# Patient Record
Sex: Male | Born: 1995 | Race: Black or African American | Hispanic: No | Smoking: Never smoker
Health system: Southern US, Community
[De-identification: ages and names within clinical notes are randomized; demographics above are authoritative.]

---

## 2015-08-28 ENCOUNTER — Encounter (HOSPITAL_COMMUNITY): Payer: Self-pay | Admitting: Emergency Medicine

## 2015-08-28 ENCOUNTER — Emergency Department (HOSPITAL_COMMUNITY)
Admission: EM | Admit: 2015-08-28 | Discharge: 2015-08-28 | Disposition: A | Discharge status: Home / Self Care | Payer: BC Managed Care – PPO | Source: Home / Self Care | Attending: Family Medicine | Admitting: Family Medicine

## 2015-08-28 ENCOUNTER — Emergency Department (INDEPENDENT_AMBULATORY_CARE_PROVIDER_SITE_OTHER): Payer: BC Managed Care – PPO

## 2015-08-28 DIAGNOSIS — M129 Arthropathy, unspecified: Secondary | ICD-10-CM

## 2015-08-28 DIAGNOSIS — M19079 Primary osteoarthritis, unspecified ankle and foot: Secondary | ICD-10-CM

## 2015-08-28 MED ORDER — NAPROXEN 500 MG PO TABS: 500.0000 mg | ORAL_TABLET | Freq: Two times a day (BID) | ORAL | Status: AC

## 2015-08-28 MED ORDER — PREDNISONE 50 MG PO TABS: ORAL_TABLET | ORAL | Status: AC

## 2015-08-28 NOTE — ED Notes (Signed)
C/o foot pain onset Thursday am when he woke up.... Denies inj/trauma Sx today include: localized fever, redness and pain when bearing wt A&O x4... Brought back in wheelchair... No acute distress.

## 2015-08-28 NOTE — ED Provider Notes (Signed)
CSN: [id number]     Arrival date & time 08/28/15  1554 History   First MD Initiated Contact with Patient 08/28/15 1741     No chief complaint on file.  (Consider location/radiation/quality/duration/timing/severity/associated sxs/prior Treatment) HPI History obtained from patient:   LOCATION: right foot SEVERITY: DURATION: CONTEXT: QUALITY: MODIFYING FACTORS: ASSOCIATED SYMPTOMS: TIMING: OCCUPATION:  No past medical history on file. No past surgical history on file. No family history on file. Social History  Substance Use Topics  . Smoking status: Not on file  . Smokeless tobacco: Not on file  . Alcohol Use: Not on file    Review of Systems ROS +'ve right foot pain  Denies: HEADACHE, NAUSEA, ABDOMINAL PAIN, CHEST PAIN, CONGESTION, DYSURIA, SHORTNESS OF BREATH  Allergies  Review of patient's allergies indicates not on file.  Home Medications   Prior to Admission medications   Not on File   Meds Ordered and Administered this Visit  Medications - No data to display  There were no vitals taken for this visit. No data found.   Physical Exam  Constitutional: He appears well-developed and well-nourished.  HENT:  Head: Normocephalic and atraumatic.  Pulmonary/Chest: Effort normal.  Musculoskeletal: He exhibits tenderness.       Right foot: There is tenderness.       Feet:  Nursing note and vitals reviewed.   ED Course  Procedures (including critical care time)  Labs Review Labs Reviewed - No data to display  Imaging Review No results found.   Visual Acuity Review  Right Eye Distance:   Left Eye Distance:   Bilateral Distance:    Right Eye Near:   Left Eye Near:    Bilateral Near:        Review of foot xray no bony injury  MDM   1. Inflammation of foot joint      Patient is advised to continue home symptomatic treatment. Prescription for  Prednisone and naproxen sent pharmacy patient has indicated. Patient is advised that if there are  new or worsening symptoms or attend the emergency department, or contact primary care provider. Instructions of care provided discharged home in stable condition.  THIS NOTE WAS GENERATED USING A VOICE RECOGNITION SOFTWARE PROGRAM. ALL REASONABLE EFFORTS  WERE MADE TO PROOFREAD THIS DOCUMENT FOR ACCURACY.    Tharon AquasFrank C Patrick, PA 08/28/15 [phone]

## 2015-08-28 NOTE — Discharge Instructions (Signed)
Gout °Gout is when your joints become red, sore, and swell (inflamed). This is caused by the buildup of uric acid crystals in the joints. Uric acid is a chemical that is normally in the blood. If the level of uric acid gets too high in the blood, these crystals form in your joints and tissues. Over time, these crystals can form into masses near the joints and tissues. These masses can destroy bone and cause the bone to look misshapen (deformed). °HOME CARE  °· Do not take aspirin for pain. °· Only take medicine as told by your doctor. °· Rest the joint as much as you can. When in bed, keep sheets and blankets off painful areas. °· Keep the sore joints raised (elevated). °· Put warm or cold packs on painful joints. Use of warm or cold packs depends on which works best for you. °· Use crutches if the painful joint is in your leg. °· Drink enough fluids to keep your pee (urine) clear or pale yellow. Limit alcohol, sugary drinks, and drinks with fructose in them. °· Follow your diet instructions. Pay careful attention to how much protein you eat. Include fruits, vegetables, whole grains, and fat-free or low-fat milk products in your daily diet. Talk to your doctor or dietitian about the use of coffee, vitamin C, and cherries. These may help lower uric acid levels. °· Keep a healthy body weight. °GET HELP RIGHT AWAY IF:  °· You have watery poop (diarrhea), throw up (vomit), or have any side effects from medicines. °· You do not feel better in 24 hours, or you are getting worse. °· Your joint becomes suddenly more tender, and you have chills or a fever. °MAKE SURE YOU:  °· Understand these instructions. °· Will watch your condition. °· Will get help right away if you are not doing well or get worse. °  °This information is not intended to replace advice given to you by your health care provider. Make sure you discuss any questions you have with your health care provider. °  °Document Released: 05/17/2008 Document Revised:  08/29/2014 Document Reviewed: 03/21/2012 °Elsevier Interactive Patient Education ©2016 [employer] ° °

## 2015-11-09 ENCOUNTER — Encounter (HOSPITAL_COMMUNITY): Payer: Self-pay | Admitting: Emergency Medicine

## 2015-11-09 ENCOUNTER — Emergency Department (HOSPITAL_COMMUNITY)
Admission: EM | Admit: 2015-11-09 | Discharge: 2015-11-09 | Disposition: A | Discharge status: Home / Self Care | Payer: BC Managed Care – PPO | Attending: Emergency Medicine | Admitting: Emergency Medicine

## 2015-11-09 DIAGNOSIS — Z791 Long term (current) use of non-steroidal anti-inflammatories (NSAID): Secondary | ICD-10-CM | POA: Insufficient documentation

## 2015-11-09 DIAGNOSIS — M25571 Pain in right ankle and joints of right foot: Secondary | ICD-10-CM | POA: Insufficient documentation

## 2015-11-09 NOTE — Discharge Instructions (Signed)
Read the information below.  You may return to the Emergency Department at any time for worsening condition or any new symptoms that concern you.  If you develop uncontrolled pain, weakness or numbness of the extremity, severe discoloration of the skin, or you are unable to walk or move your ankle, return to the ER for a recheck.      Ankle Pain Ankle pain is a common symptom. The bones, cartilage, tendons, and muscles of the ankle joint perform a lot of work each day. The ankle joint holds your body weight and allows you to move around. Ankle pain can occur on either side or back of 1 or both ankles. Ankle pain may be sharp and burning or dull and aching. There may be tenderness, stiffness, redness, or warmth around the ankle. The pain occurs more often when a person walks or puts pressure on the ankle. CAUSES  There are many reasons ankle pain can develop. It is important to work with your caregiver to identify the cause since many conditions can impact the bones, cartilage, muscles, and tendons. Causes for ankle pain include:  Injury, including a break (fracture), sprain, or strain often due to a fall, sports, or a high-impact activity.  Swelling (inflammation) of a tendon (tendonitis).  Achilles tendon rupture.  Ankle instability after repeated sprains and strains.  Poor foot alignment.  Pressure on a nerve (tarsal tunnel syndrome).  Arthritis in the ankle or the lining of the ankle.  Crystal formation in the ankle (gout or pseudogout). DIAGNOSIS  A diagnosis is based on your medical history, your symptoms, results of your physical exam, and results of diagnostic tests. Diagnostic tests may include X-ray exams or a computerized magnetic scan (magnetic resonance imaging, MRI). TREATMENT  Treatment will depend on the cause of your ankle pain and may include:  Keeping pressure off the ankle and limiting activities.  Using crutches or other walking support (a cane or brace).  Using  rest, ice, compression, and elevation.  Participating in physical therapy or home exercises.  Wearing shoe inserts or special shoes.  Losing weight.  Taking medications to reduce pain or swelling or receiving an injection.  Undergoing surgery. HOME CARE INSTRUCTIONS   Only take over-the-counter or prescription medicines for pain, discomfort, or fever as directed by your caregiver.  Put ice on the injured area.  Put ice in a plastic bag.  Place a towel between your skin and the bag.  Leave the ice on for 15-20 minutes at a time, 03-04 times a day.  Keep your leg raised (elevated) when possible to lessen swelling.  Avoid activities that cause ankle pain.  Follow specific exercises as directed by your caregiver.  Record how often you have ankle pain, the location of the pain, and what it feels like. This information may be helpful to you and your caregiver.  Ask your caregiver about returning to work or sports and whether you should drive.  Follow up with your caregiver for further examination, therapy, or testing as directed. SEEK MEDICAL CARE IF:   Pain or swelling continues or worsens beyond 1 week.  You have an oral temperature above 102 F (38.9 C).  You are feeling unwell or have chills.  You are having an increasingly difficult time with walking.  You have loss of sensation or other new symptoms.  You have questions or concerns. MAKE SURE YOU:   Understand these instructions.  Will watch your condition.  Will get help right away if you are  not doing well or get worse.   This information is not intended to replace advice given to you by your health care provider. Make sure you discuss any questions you have with your health care provider.   Document Released: 01/26/2010 Document Revised: 10/31/2011 Document Reviewed: 03/10/2015 Elsevier Interactive Patient Education [employer]

## 2015-11-09 NOTE — ED Notes (Signed)
Pt c/o right ankle pain onset Friday night while at work. Few months ago pt went to an urgent care for pain in right foot and was given naproxen. Pt has crutches. Pt wants to be tested for gout.

## 2015-11-09 NOTE — ED Provider Notes (Signed)
CSN: [id number]     Arrival date & time 11/09/15  4098 History   First MD Initiated Contact with Patient 11/09/15 [phone]     Chief Complaint  Patient presents with  . Ankle Pain     (Consider location/radiation/quality/duration/timing/severity/associated sxs/prior Treatment) The history is provided by the patient.     Pt presents with right medial ankle pain that has been ongoing x 4 days since work.  States every day after work it has hurt.  The pain is just anterior to the medial malleolus, hurts with walking.  States previously he had problems with his right foot that was treated as gout, father and grandfather have hx gout and parents wanted him tested for this.  States the symptoms today are totally different but that he has had problems with this right ankle and foot for years.  States his parents wanted him tested for gout.    History reviewed. No pertinent past medical history. History reviewed. No pertinent past surgical history. No family history on file. Social History  Substance Use Topics  . Smoking status: Never Smoker   . Smokeless tobacco: None  . Alcohol Use: Yes    Review of Systems  Constitutional: Negative for fever and chills.  Cardiovascular: Negative for leg swelling.  Musculoskeletal: Positive for arthralgias and gait problem.  Skin: Negative for color change, pallor, rash and wound.  Allergic/Immunologic: Negative for immunocompromised state.  Neurological: Negative for weakness and numbness.  Psychiatric/Behavioral: Negative for self-injury.      Allergies  Review of patient's allergies indicates no known allergies.  Home Medications   Prior to Admission medications   Medication Sig Start Date End Date Taking? Authorizing Provider  naproxen (NAPROSYN) 500 MG tablet Take 1 tablet (500 mg total) by mouth 2 (two) times daily with a meal. 08/28/15   Tharon Aquas, PA  predniSONE (DELTASONE) 50 MG tablet 1 tablet daily for 5 days 08/28/15   Tharon Aquas, PA   BP 145/89 mmHg  Pulse 74  Temp(Src) 97.8 F (36.6 C)  Resp 18  Ht  (1.727 m)  Wt 122.471 kg  BMI 41.06 kg/m2  SpO2 100% Physical Exam  Constitutional: He appears well-developed and well-nourished. No distress.  HENT:  Head: Normocephalic and atraumatic.  Neck: Neck supple.  Cardiovascular: Normal rate and intact distal pulses.   Pulmonary/Chest: Effort normal.  Musculoskeletal: Normal range of motion. He exhibits no edema.  Right calf, ankle, and foot without erythema, edema, warmth, tenderness.    Neurological: He is alert. He exhibits normal muscle tone.  Skin: He is not diaphoretic. No erythema.  Psychiatric: He has a normal mood and affect. His behavior is normal.  Nursing note and vitals reviewed.   ED Course  Procedures (including critical care time) Labs Review Labs Reviewed - No data to display  Imaging Review No results found. I have personally reviewed and evaluated these images and lab results as part of my medical decision-making.   EKG Interpretation None      MDM   Final diagnoses:  Ankle pain, right    Afebrile, nontoxic patient with right ankle pain x several days without significant injury.  Has hx gout, this does not feel like that and clinically is not consistent with gout.  No injury concerning for fracture.  Neurovascularly intact.  Discussed "testing for gout" involved and given no current flare up unlikely to be beneficial at this time. Engaged in joint medical decision making with the patient.  Discussed use  of uric acid blood test - pt opted to follow up with primary care provider for discussion of diagnosis/treatment.  Pt has only had one episode of symptoms diagnosed by urgent care as gout, no testing, does have strong family history.  Xray not emergently indicated.  Pt given information regarding gout and related diet.  Suspect today's with ankle pain is likely due to recurrent irritation of old injury.  D/C home with aso,  (pt has own crutches), encouraged naprosyn.    Discussed result, findings, treatment, and follow up  with patient.  Pt given return precautions.  Pt verbalizes understanding and agrees with plan.        Trixie Dredgemily Francess Mullen, PA-C 11/09/15 [phone]  Melene Planan Floyd, DO 11/09/15 1109

## 2015-11-09 NOTE — ED Notes (Signed)
Declined W/C at D/C and was escorted to lobby by RN. 

## 2016-11-13 IMAGING — DX DG FOOT COMPLETE 3+V*R*
3 series · 3 of 3 positions shown · non-contrast
Comparison: None.

CLINICAL DATA: Per pt: woke up yesterday to the right foot painful
and swollen with no injury. Patient pointed to the right foot, the
great toe and the first metatarsal. No history of Gout. Patient is
not a diabetic

EXAM:
RIGHT FOOT COMPLETE - 3+ VIEW

[foot ap]
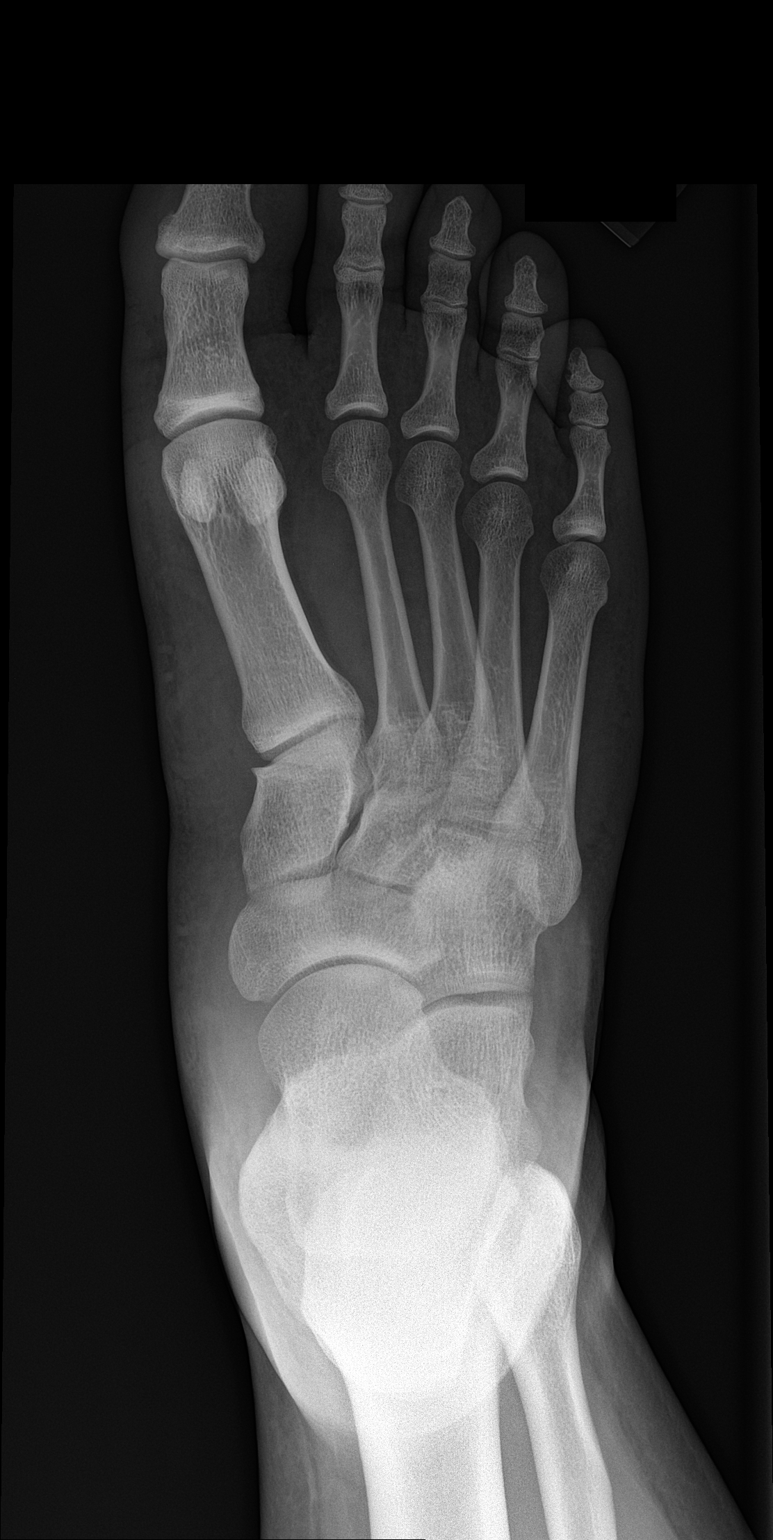

[foot obl]
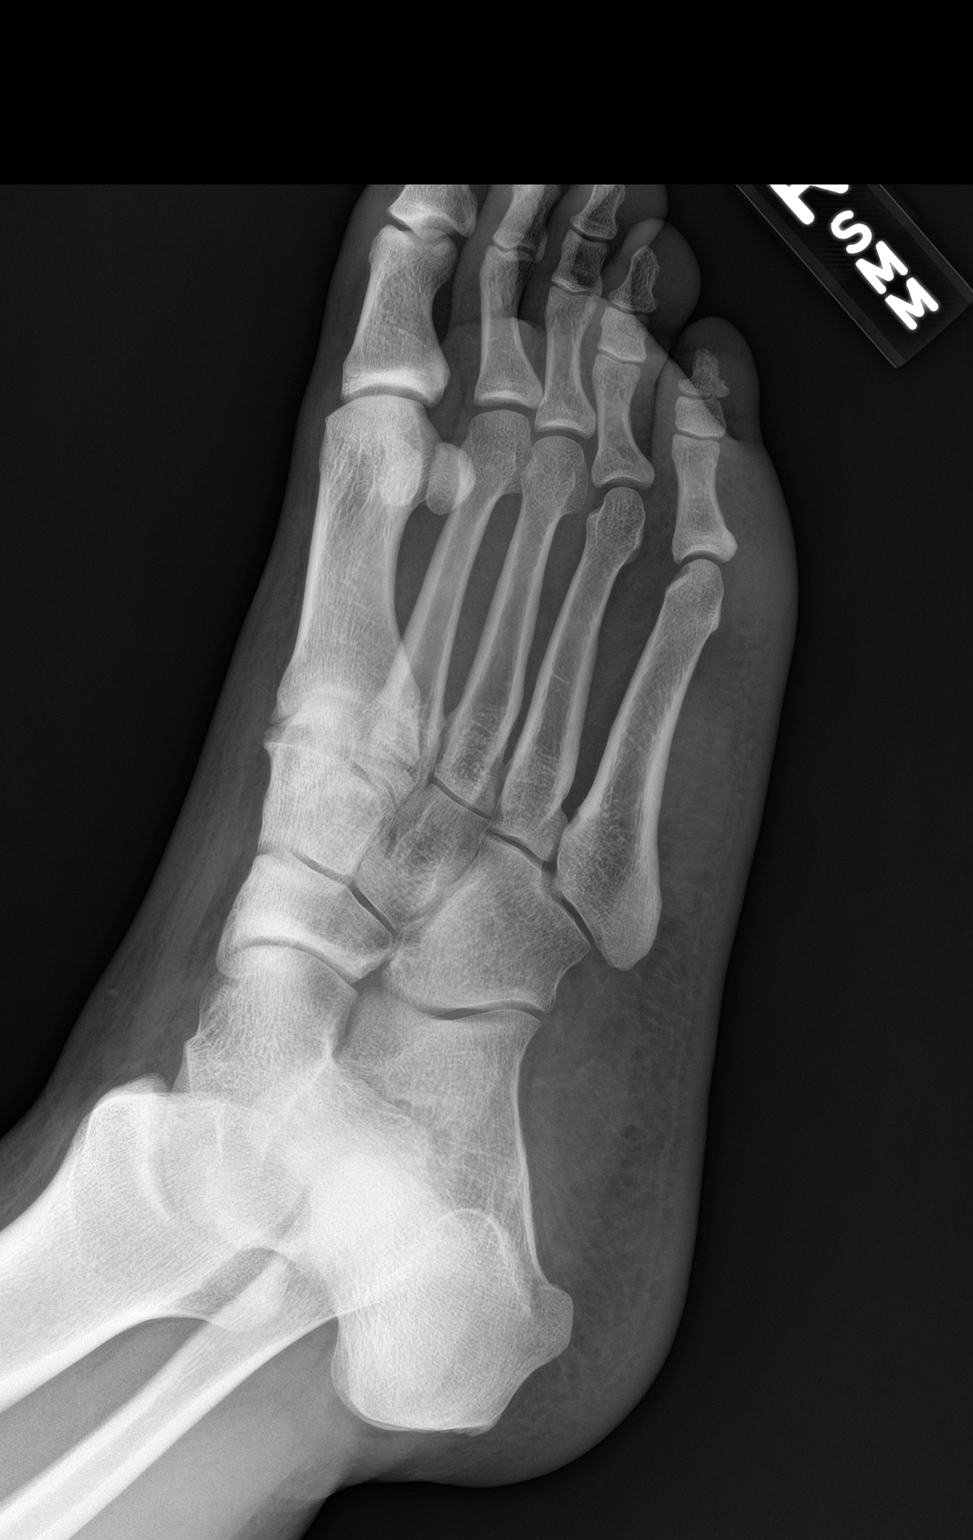

[foot lat]
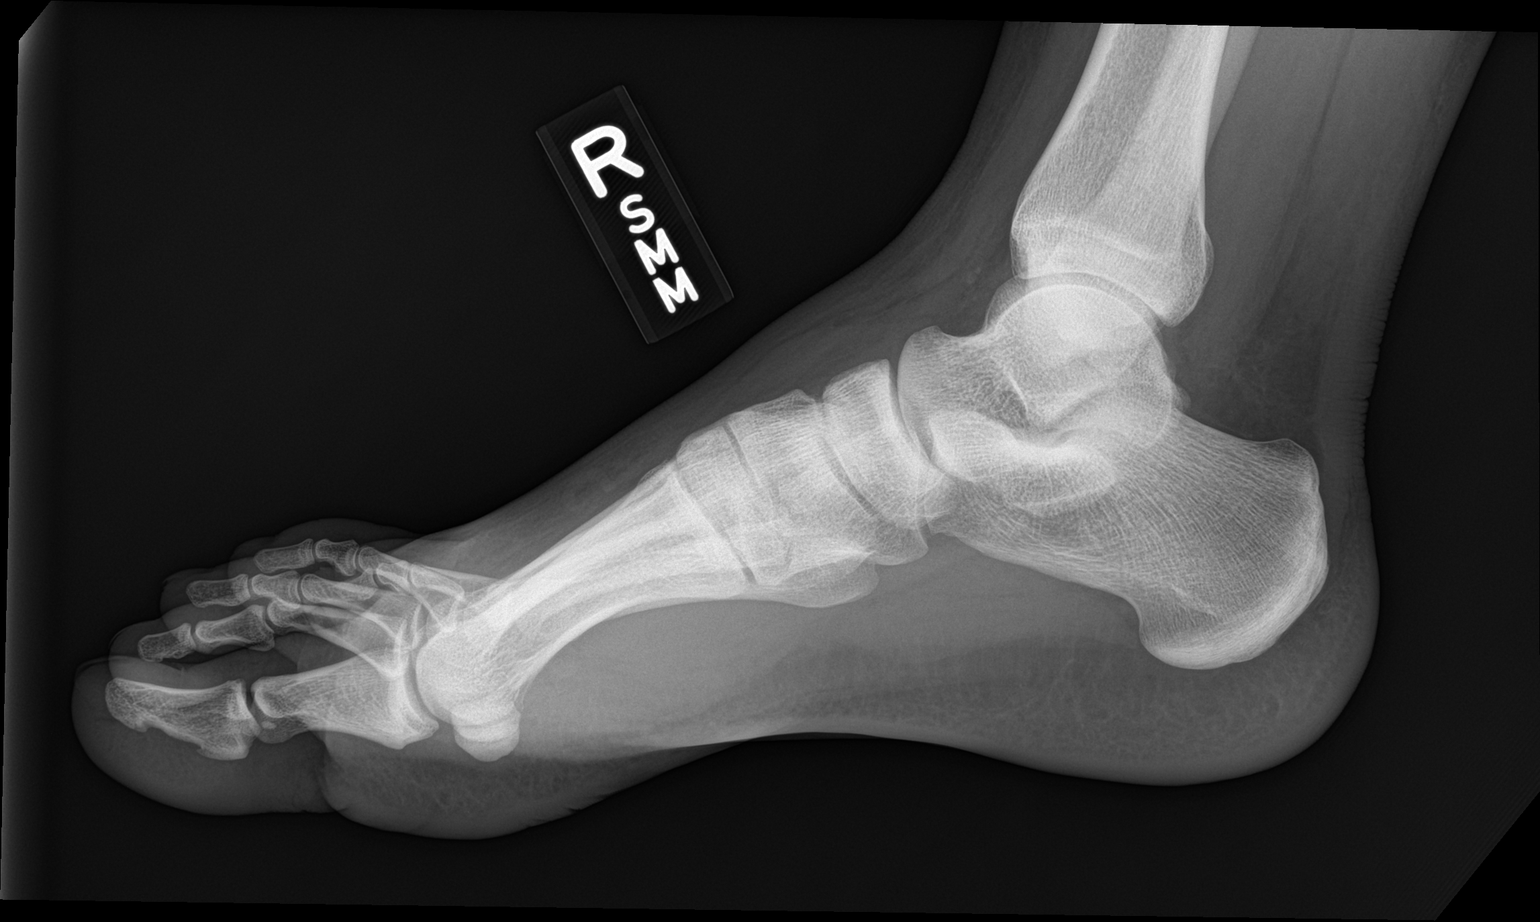

[3 of 3 positions shown; findings below may reference images not displayed]

FINDINGS: No fracture or dislocation of mid foot or forefoot. The phalanges
are normal. The calcaneus is normal. No soft tissue abnormality.
IMPRESSION: No acute osseous abnormality.
